# Patient Record
Sex: Male | Born: 1990 | Race: White | Hispanic: No | Marital: Single | Smoking: Never smoker
Health system: Southern US, Community
[De-identification: ages and names within clinical notes are randomized; demographics above are authoritative.]

## PROBLEM LIST (undated history)

## (undated) DIAGNOSIS — I10 Essential (primary) hypertension: Secondary | ICD-10-CM

## (undated) HISTORY — PX: OTHER SURGICAL HISTORY: SHX169

---

## 2015-02-08 ENCOUNTER — Emergency Department (HOSPITAL_COMMUNITY)

## 2015-02-08 ENCOUNTER — Encounter (HOSPITAL_COMMUNITY): Payer: Self-pay | Admitting: Emergency Medicine

## 2015-02-08 ENCOUNTER — Emergency Department (HOSPITAL_COMMUNITY)
Admission: EM | Admit: 2015-02-08 | Discharge: 2015-02-09 | Disposition: A | Discharge status: Home / Self Care | Attending: Emergency Medicine | Admitting: Emergency Medicine

## 2015-02-08 DIAGNOSIS — Y929 Unspecified place or not applicable: Secondary | ICD-10-CM | POA: Diagnosis not present

## 2015-02-08 DIAGNOSIS — S63501A Unspecified sprain of right wrist, initial encounter: Secondary | ICD-10-CM

## 2015-02-08 DIAGNOSIS — Z9889 Other specified postprocedural states: Secondary | ICD-10-CM | POA: Insufficient documentation

## 2015-02-08 DIAGNOSIS — Z79899 Other long term (current) drug therapy: Secondary | ICD-10-CM | POA: Insufficient documentation

## 2015-02-08 DIAGNOSIS — W500XXA Accidental hit or strike by another person, initial encounter: Secondary | ICD-10-CM | POA: Insufficient documentation

## 2015-02-08 DIAGNOSIS — S59911A Unspecified injury of right forearm, initial encounter: Secondary | ICD-10-CM | POA: Diagnosis present

## 2015-02-08 DIAGNOSIS — Y998 Other external cause status: Secondary | ICD-10-CM | POA: Diagnosis not present

## 2015-02-08 DIAGNOSIS — S638X1A Sprain of other part of right wrist and hand, initial encounter: Secondary | ICD-10-CM | POA: Insufficient documentation

## 2015-02-08 DIAGNOSIS — Y9389 Activity, other specified: Secondary | ICD-10-CM | POA: Insufficient documentation
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
## 2015-02-09 MED ORDER — IBUPROFEN 800 MG PO TABS: 800.0000 mg | ORAL_TABLET | Freq: Three times a day (TID) | ORAL | Status: AC

## 2015-02-09 MED ORDER — HYDROCODONE-ACETAMINOPHEN 5-325 MG PO TABS
2.0000 | ORAL_TABLET | Freq: Once | ORAL | Status: AC
  Administered 2015-02-09: 2 via ORAL
  Filled 2015-02-09: qty 2

## 2015-02-09 NOTE — Discharge Instructions (Signed)
Wear the ace wrap for comfort, apply ice, take ibuprofen for pain. Follow up with ortho if symptoms persist. Return here as needed.

## 2015-02-17 ENCOUNTER — Emergency Department (HOSPITAL_COMMUNITY)

## 2015-02-17 ENCOUNTER — Emergency Department (HOSPITAL_COMMUNITY)
Admission: EM | Admit: 2015-02-17 | Discharge: 2015-02-18 | Disposition: A | Discharge status: Home / Self Care | Attending: Emergency Medicine | Admitting: Emergency Medicine

## 2015-02-17 ENCOUNTER — Encounter (HOSPITAL_COMMUNITY): Payer: Self-pay | Admitting: Emergency Medicine

## 2015-02-17 DIAGNOSIS — Z79899 Other long term (current) drug therapy: Secondary | ICD-10-CM | POA: Diagnosis not present

## 2015-02-17 DIAGNOSIS — Z791 Long term (current) use of non-steroidal anti-inflammatories (NSAID): Secondary | ICD-10-CM | POA: Insufficient documentation

## 2015-02-17 DIAGNOSIS — Y998 Other external cause status: Secondary | ICD-10-CM | POA: Diagnosis not present

## 2015-02-17 DIAGNOSIS — S59911A Unspecified injury of right forearm, initial encounter: Secondary | ICD-10-CM | POA: Diagnosis not present

## 2015-02-17 DIAGNOSIS — Y9389 Activity, other specified: Secondary | ICD-10-CM | POA: Diagnosis not present

## 2015-02-17 DIAGNOSIS — Y92143 Cell of prison as the place of occurrence of the external cause: Secondary | ICD-10-CM | POA: Diagnosis not present

## 2015-02-17 DIAGNOSIS — R569 Unspecified convulsions: Secondary | ICD-10-CM | POA: Diagnosis present

## 2015-02-17 DIAGNOSIS — W1839XA Other fall on same level, initial encounter: Secondary | ICD-10-CM | POA: Insufficient documentation

## 2015-02-17 DIAGNOSIS — R251 Tremor, unspecified: Secondary | ICD-10-CM | POA: Insufficient documentation

## 2015-02-17 DIAGNOSIS — I10 Essential (primary) hypertension: Secondary | ICD-10-CM | POA: Insufficient documentation

## 2015-02-17 HISTORY — DX: Essential (primary) hypertension: I10

## 2015-02-17 LAB — BASIC METABOLIC PANEL
Anion gap: 7 (ref 5–15)
BUN: 13 mg/dL (ref 6–20)
CO2: 28 mmol/L (ref 22–32)
CREATININE: 0.77 mg/dL (ref 0.61–1.24)
Calcium: 8.7 mg/dL — ABNORMAL LOW (ref 8.9–10.3)
Chloride: 102 mmol/L (ref 101–111)
GFR calc Af Amer: >60 mL/min (ref >60)
Glucose, Bld: 85 mg/dL (ref 65–99)
Potassium: 4.1 mmol/L (ref 3.5–5.1)
SODIUM: 137 mmol/L (ref 135–145)

## 2015-02-17 LAB — CBC
HCT: 39.3 % (ref 39.0–52.0)
Hemoglobin: 13.5 g/dL (ref 13.0–17.0)
MCH: 29.1 pg (ref 26.0–34.0)
MCHC: 34.4 g/dL (ref 30.0–36.0)
MCV: 84.7 fL (ref 78.0–100.0)
PLATELETS: 170 10*3/uL (ref 150–400)
RBC: 4.64 MIL/uL (ref 4.22–5.81)
RDW: 13.6 % (ref 11.5–15.5)
WBC: 7.2 10*3/uL (ref 4.0–10.5)

## 2015-02-17 LAB — CBG MONITORING, ED: GLUCOSE-CAPILLARY: 96 mg/dL (ref 65–99)

## 2015-02-17 MED ORDER — ACETAMINOPHEN 500 MG PO TABS
1000.0000 mg | ORAL_TABLET | Freq: Once | ORAL | Status: AC
  Administered 2015-02-18: 1000 mg via ORAL
  Filled 2015-02-17: qty 2
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
## 2015-02-17 NOTE — ED Notes (Signed)
cbg of 96 
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
## 2015-02-18 LAB — RAPID URINE DRUG SCREEN, HOSP PERFORMED
Amphetamines: NOT DETECTED
BARBITURATES: NOT DETECTED
Benzodiazepines: NOT DETECTED
Cocaine: NOT DETECTED
OPIATES: NOT DETECTED
TETRAHYDROCANNABINOL: NOT DETECTED

## 2015-02-18 LAB — URINALYSIS, ROUTINE W REFLEX MICROSCOPIC
BILIRUBIN URINE: NEGATIVE
Glucose, UA: NEGATIVE mg/dL
HGB URINE DIPSTICK: NEGATIVE
KETONES UR: NEGATIVE mg/dL
Leukocytes, UA: NEGATIVE
Nitrite: NEGATIVE
PH: 6 (ref 5.0–8.0)
Protein, ur: NEGATIVE mg/dL
SPECIFIC GRAVITY, URINE: 1.01 (ref 1.005–1.030)
UROBILINOGEN UA: 0.2 mg/dL (ref 0.0–1.0)

## 2015-02-18 NOTE — ED Notes (Signed)
Discharge instructions given, pt demonstrated teach back and verbal understanding. No concerns voiced.  

## 2015-02-18 NOTE — Discharge Instructions (Signed)
You were seen in the emergency department for a possible seizure. Your labs, urine, EKG, head CT and on x-ray showed no abnormalities. Given this is your first seizure in many years, we do not start to on seizure medication. I recommend you follow-up with a neurologist who may decide to put you on anti-epileptic medication.  You may take Tylenol 1000 mg every 6 hours as needed for pain.   Seizure, Adult A seizure is abnormal electrical activity in the brain. Seizures usually last from 30 seconds to 2 minutes. There are various types of seizures. Before a seizure, you may have a warning sensation (aura) that a seizure is about to occur. An aura may include the following symptoms:   Fear or anxiety.  Nausea.  Feeling like the room is spinning (vertigo).  Vision changes, such as seeing flashing lights or spots. Common symptoms during a seizure include:  A change in attention or behavior (altered mental status).  Convulsions with rhythmic jerking movements.  Drooling.  Rapid eye movements.  Grunting.  Loss of bladder and bowel control.  Bitter taste in the mouth.  Tongue biting. After a seizure, you may feel confused and sleepy. You may also have an injury resulting from convulsions during the seizure. HOME CARE INSTRUCTIONS   If you are given medicines, take them exactly as prescribed by your health care provider.  Keep all follow-up appointments as directed by your health care provider.  Do not swim or drive or engage in risky activity during which a seizure could cause further injury to you or others until your health care provider says it is OK.  Get adequate rest.  Teach friends and family what to do if you have a seizure. They should:  Lay you on the ground to prevent a fall.  Put a cushion under your head.  Loosen any tight clothing around your neck.  Turn you on your side. If vomiting occurs, this helps keep your airway clear.  Stay with you until you  recover.  Know whether or not you need emergency care. SEEK IMMEDIATE MEDICAL CARE IF:  The seizure lasts longer than 5 minutes.  The seizure is severe or you do not wake up immediately after the seizure.  You have an altered mental status after the seizure.  You are having more frequent or worsening seizures. Someone should drive you to the emergency department or call local emergency services (911 in [place] MAKE SURE YOU:  Understand these instructions.  Will watch your condition.  Will get help right away if you are not doing well or get worse. Document Released: 06/09/2000 Document Revised: 04/02/2013 Document Reviewed: 01/22/2013 [facility] Patient Information 2015 [place], [place]. This information is not intended to replace advice given to you by your health care provider. Make sure you discuss any questions you have with your health care provider.  Driving and Equipment Restrictions Some medical problems make it dangerous to drive, ride a bike, or use machines. Some of these problems are:  A hard blow to the head (concussion).  Passing out (fainting).  Twitching and shaking (seizures).  Low blood sugar.  Taking medicine to help you relax (sedatives).  Taking pain medicines.  Wearing an eye patch.  Wearing splints. This can make it hard to use parts of your body that you need to drive safely. HOME CARE   Do not drive until your doctor says it is okay.  Do not use machines until your doctor says it is okay. You may need a  form signed by your doctor (medical release) before you can drive again. You may also need this form before you do other tasks where you need to be fully alert. MAKE SURE YOU:  Understand these instructions.  Will watch your condition.  Will get help right away if you are not doing well or get worse. Document Released: 07/20/2004 Document Revised: 09/04/2011 Document Reviewed: 10/20/2009 [facility] Patient Information 2015 [place], [place].  This information is not intended to replace advice given to you by your health care provider. Make sure you discuss any questions you have with your health care provider.

## 2015-02-21 ENCOUNTER — Encounter (HOSPITAL_COMMUNITY): Payer: Self-pay | Admitting: Emergency Medicine

## 2015-02-21 ENCOUNTER — Emergency Department (HOSPITAL_COMMUNITY)
Admission: EM | Admit: 2015-02-21 | Discharge: 2015-02-22 | Disposition: A | Discharge status: Home / Self Care | Attending: Emergency Medicine | Admitting: Emergency Medicine

## 2015-02-21 DIAGNOSIS — R569 Unspecified convulsions: Secondary | ICD-10-CM | POA: Insufficient documentation

## 2015-02-21 DIAGNOSIS — K029 Dental caries, unspecified: Secondary | ICD-10-CM | POA: Diagnosis not present

## 2015-02-21 DIAGNOSIS — I10 Essential (primary) hypertension: Secondary | ICD-10-CM | POA: Diagnosis not present

## 2015-02-21 DIAGNOSIS — Z79899 Other long term (current) drug therapy: Secondary | ICD-10-CM | POA: Diagnosis not present

## 2015-02-21 LAB — CBG MONITORING, ED: Glucose-Capillary: 86 mg/dL (ref 65–99)

## 2015-02-21 MED ORDER — LEVETIRACETAM 500 MG PO TABS
500.0000 mg | ORAL_TABLET | Freq: Two times a day (BID) | ORAL | Status: AC
Start: 2015-02-21 — End: ?

## 2015-02-21 MED ORDER — LEVETIRACETAM 500 MG PO TABS
1000.0000 mg | ORAL_TABLET | Freq: Once | ORAL | Status: AC
  Administered 2015-02-22: 1000 mg via ORAL
  Filled 2015-02-21: qty 2
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
## 2015-02-21 NOTE — Discharge Instructions (Signed)
Seizure, Adult °A seizure is abnormal electrical activity in the brain. Seizures usually last from 30 seconds to 2 minutes. There are various types of seizures. °Before a seizure, you may have a warning sensation (aura) that a seizure is about to occur. An aura may include the following symptoms:  °· Fear or anxiety. °· Nausea. °· Feeling like the room is spinning (vertigo). °· Vision changes, such as seeing flashing lights or spots. °Common symptoms during a seizure include: °· A change in attention or behavior (altered mental status). °· Convulsions with rhythmic jerking movements. °· Drooling. °· Rapid eye movements. °· Grunting. °· Loss of bladder and bowel control. °· Bitter taste in the mouth. °· Tongue biting. °After a seizure, you may feel confused and sleepy. You may also have an injury resulting from convulsions during the seizure. °HOME CARE INSTRUCTIONS  °· If you are given medicines, take them exactly as prescribed by your health care provider. °· Keep all follow-up appointments as directed by your health care provider. °· Do not swim or drive or engage in risky activity during which a seizure could cause further injury to you or others until your health care provider says it is OK. °· Get adequate rest. °· Teach friends and family what to do if you have a seizure. They should: °¨ Lay you on the ground to prevent a fall. °¨ Put a cushion under your head. °¨ Loosen any tight clothing around your neck. °¨ Turn you on your side. If vomiting occurs, this helps keep your airway clear. °¨ Stay with you until you recover. °¨ Know whether or not you need emergency care. °SEEK IMMEDIATE MEDICAL CARE IF: °· The seizure lasts longer than 5 minutes. °· The seizure is severe or you do not wake up immediately after the seizure. °· You have an altered mental status after the seizure. °· You are having more frequent or worsening seizures. °Someone should drive you to the emergency department or call local emergency  services (911 in [place] °MAKE SURE YOU: °· Understand these instructions. °· Will watch your condition. °· Will get help right away if you are not doing well or get worse. °Document Released: 06/09/2000 Document Revised: 04/02/2013 Document Reviewed: 01/22/2013 °[employer]® Patient Information ©2015 [employer]. This information is not intended to replace advice given to you by your health care provider. Make sure you discuss any questions you have with your health care provider. °Levetiracetam tablets °What is this medicine? °LEVETIRACETAM (lee ve tye RA se tam) is an antiepileptic drug. It is used with other medicines to treat certain types of seizures. °This medicine may be used for other purposes; ask your health care provider or pharmacist if you have questions. °COMMON BRAND NAME(S): Keppra °What should I tell my health care provider before I take this medicine? °They need to know if you have any of these conditions: °-kidney disease °-suicidal thoughts, plans, or attempt; a previous suicide attempt by you or a family member °-an unusual or allergic reaction to levetiracetam, other medicines, foods, dyes, or preservatives °-pregnant or trying to get pregnant °-breast-feeding °How should I use this medicine? °Take this medicine by mouth with a glass of water. Follow the directions on the prescription label. Swallow the tablets whole. Do not crush or chew this medicine. You may take this medicine with or without food. Take your doses at regular intervals. Do not take your medicine more often than directed. Do not stop taking this medicine or any of your seizure medicines unless   instructed by your doctor or health care professional. Stopping your medicine suddenly can increase your seizures or their severity. °A special MedGuide will be given to you by the pharmacist with each prescription and refill. Be sure to read this information carefully each time. °Contact your pediatrician or health care professional  regarding the use of this medication in children. While this drug may be prescribed for children as young as 4 years of age for selected conditions, precautions do apply. °Overdosage: If you think you have taken too much of this medicine contact a poison control center or emergency room at once. °NOTE: This medicine is only for you. Do not share this medicine with others. °What if I miss a dose? °If you miss a dose, take it as soon as you can. If it is almost time for your next dose, take only that dose. Do not take double or extra doses. °What may interact with this medicine? °This medicine may interact with the following medications: °-carbamazepine °-colesevelam °-probenecid °-sevelamer °This list may not describe all possible interactions. Give your health care provider a list of all the medicines, herbs, non-prescription drugs, or dietary supplements you use. Also tell them if you smoke, drink alcohol, or use illegal drugs. Some items may interact with your medicine. °What should I watch for while using this medicine? °Visit your doctor or health care professional for a regular check on your progress. Wear a medical identification bracelet or chain to say you have epilepsy, and carry a card that lists all your medications. °It is important to take this medicine exactly as instructed by your health care professional. When first starting treatment, your dose may need to be adjusted. It may take weeks or months before your dose is stable. You should contact your doctor or health care professional if your seizures get worse or if you have any new types of seizures. °You may get drowsy or dizzy. Do not drive, use machinery, or do anything that needs mental alertness until you know how this medicine affects you. Do not stand or sit up quickly, especially if you are an older patient. This reduces the risk of dizzy or fainting spells. Alcohol may interfere with the effect of this medicine. Avoid alcoholic drinks. °The  use of this medicine may increase the chance of suicidal thoughts or actions. Pay special attention to how you are responding while on this medicine. Any worsening of mood, or thoughts of suicide or dying should be reported to your health care professional right away. °Women who become pregnant while using this medicine may enroll in the [place] Antiepileptic Drug Pregnancy Registry by calling 1-888-233-2334. This registry collects information about the safety of antiepileptic drug use during pregnancy. °What side effects may I notice from receiving this medicine? °Side effects you should report to your doctor or health care professional as soon as possible: °-allergic reactions like skin rash, itching or hives, swelling of the face, lips, or tongue °-breathing problems °-dark urine °-general ill feeling or flu-like symptoms °-problems with balance, talking, walking °-unusually weak or tired °-worsening of mood, thoughts or actions of suicide or dying °-yellowing of the eyes or skin °Side effects that usually do not require medical attention (report to your doctor or health care professional if they continue or are bothersome): °-diarrhea °-dizzy, drowsy °-headache °-loss of appetite °This list may not describe all possible side effects. Call your doctor for medical advice about side effects. You may report side effects to FDA at 1-800-FDA-1088. °Where should   I keep my medicine? °Keep out of reach of children. °Store at room temperature between 15 and 30 degrees C (59 and 86 degrees F). Throw away any unused medicine after the expiration date. °NOTE: This sheet is a summary. It may not cover all possible information. If you have questions about this medicine, talk to your doctor, pharmacist, or health care provider. °© 2015, [employer]. (2013-05-06 08:42:48) ° °

## 2015-02-21 NOTE — ED Provider Notes (Signed)
CSN: [id number]     Arrival date & time 02/21/15  2314 History  This chart was scribed for Dione Booze, MD by Budd Palmer, ED Scribe. This patient was seen in room [facility]18[facility]18 and the patient's care was started at 11:45 PM.    Chief Complaint  Patient presents with  . Seizures   The history is provided by the patient and a caregiver. No language interpreter was used.   HPI Comments: [place] is a 24 y.o. male who presents to the Emergency Department complaining of seizures that occurred at about 10 PM tonight, lasting for about 2-3 minutes. Per his caregiver, he seized twice more during the 20 minute ride to the hospital and was unresponsive for a time after each episode. Pt notes associated breaking of a previously damaged upper left tooth. He has been seen for the same in the ED 3 days ago. Pt denies bladder or bowel incontinence.  Past Medical History  Diagnosis Date  . Hypertension    Past Surgical History  Procedure Laterality Date  . Arm surgery     No family history on file. Social History  Substance Use Topics  . Smoking status: Never Smoker   . Smokeless tobacco: None  . Alcohol Use: No    Review of Systems  HENT: Positive for dental problem.   Neurological: Positive for seizures.  All other systems reviewed and are negative.   Allergies  Review of patient's allergies indicates not on file.  Home Medications   Prior to Admission medications   Medication Sig Start Date End Date Taking? Authorizing Provider  ibuprofen (ADVIL,MOTRIN) 800 MG tablet Take 1 tablet (800 mg total) by mouth 3 (three) times daily. 02/09/15   Hope Orlene Och, NP  lisinopril (PRINIVIL,ZESTRIL) 10 MG tablet Take 10 mg by mouth daily.    Historical Provider, MD   BP 134/83 mmHg  Pulse 77  Temp(Src) 98.5 F (36.9 C) (Oral)  Ht 5\' 10"  (1.778 m)  Wt 185 lb (83.915 kg)  BMI 26.54 kg/m2  SpO2 100% Physical Exam  Constitutional: He is oriented to person, place, and time. He appears  well-developed and well-nourished.  HENT:  Head: Normocephalic and atraumatic.  Tooth # 15 has a large caries on the mucosal surface. No sharp edges.  Eyes: EOM are normal. Pupils are equal, round, and reactive to light.  Neck: Normal range of motion. Neck supple. No JVD present.  Cardiovascular: Normal rate, regular rhythm and normal heart sounds.   No murmur heard. Pulmonary/Chest: Effort normal and breath sounds normal. He has no wheezes. He has no rales. He exhibits no tenderness.  Abdominal: Soft. Bowel sounds are normal. He exhibits no distension and no mass. There is no tenderness.  Musculoskeletal: Normal range of motion. He exhibits no edema.  Lymphadenopathy:    He has no cervical adenopathy.  Neurological: He is alert and oriented to person, place, and time. No cranial nerve deficit. He exhibits normal muscle tone. Coordination normal.  Skin: Skin is warm and dry. No rash noted. He is not diaphoretic. No erythema.  Psychiatric: He has a normal mood and affect. His behavior is normal.  Nursing note and vitals reviewed.   ED Course  Procedures  DIAGNOSTIC STUDIES: Oxygen Saturation is 100% on RA, normal by my interpretation.    COORDINATION OF CARE: 11:51 PM - Discussed plans to order anti-convulsive medication. Advised to take medication 2x per day and f/u with his PCP. Pt advised of plan for treatment and pt agrees.  EKG Interpretation   Date/Time:  Sunday February 21 2015 23:32:04 EDT Ventricular Rate:  84 PR Interval:  146 QRS Duration: 97 QT Interval:  363 QTC Calculation: 429 R Axis:   95 Text Interpretation:  Sinus rhythm Borderline right axis deviation When  compared with ECG of 02/17/2015, No significant change was found Confirmed  by [facility]  MD, Shomari Scicchitano ([id number]) on 02/22/2015 12:04:14 AM      MDM   Final diagnoses:  Seizure    Seizure. Old records are reviewed and he was seen in the ED 3 days ago with seizures. He had complete seizure workup done at that  time, so there is no need to repeat them. He apparently had seizures as a child. I do not feel he will need EEG. However, with ongoing seizures, he will need to be placed on anticonvulsives. He is given a dose of levetiracetam here and discharged with prescription for levetiracetam.  I personally performed the services described in this documentation, which was scribed in my presence. The recorded information has been reviewed and is accurate.      Dione Booze, MD 02/22/15 [id number]

## 2015-02-21 NOTE — ED Notes (Signed)
EKG done and given to Dr Preston Fleeting

## 2015-02-22 NOTE — ED Notes (Signed)
Report given to triage nurse at [facility]

## 2016-02-18 IMAGING — CT CT HEAD W/O CM
1 series · 16 of 30 positions shown, 20 images · non-contrast
Comparison: None.

CLINICAL DATA: Seizure today.

EXAM:
CT HEAD WITHOUT CONTRAST
TECHNIQUE: Contiguous axial images were obtained from the base of the skull
through the vertex without intravenous contrast.

[Series 2: headtrauma 4.8 h37s · axial · 0.48mm/px · z∈[+96,+227]mm · 16 of 30 slices shown, 20 images]
[im 2/30  brain]
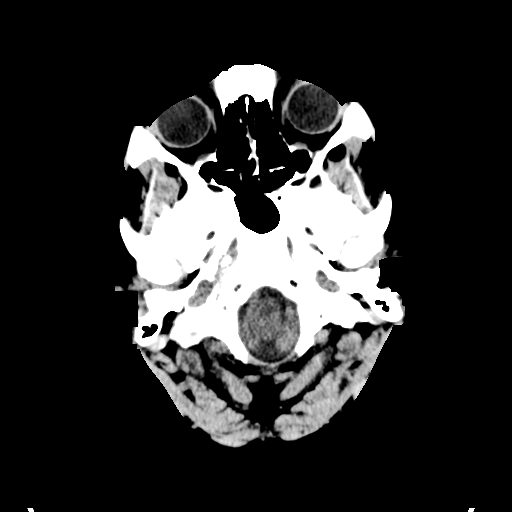
[im 2/30  bone]
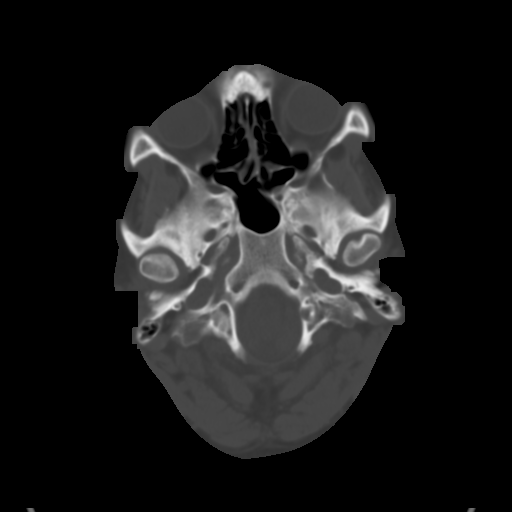
[im 4/30  brain]
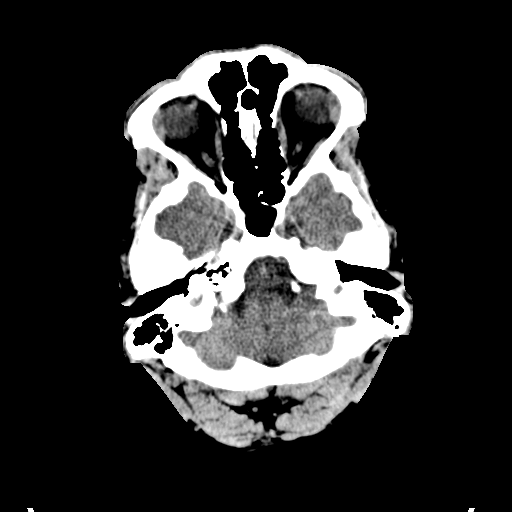
[im 6/30  brain]
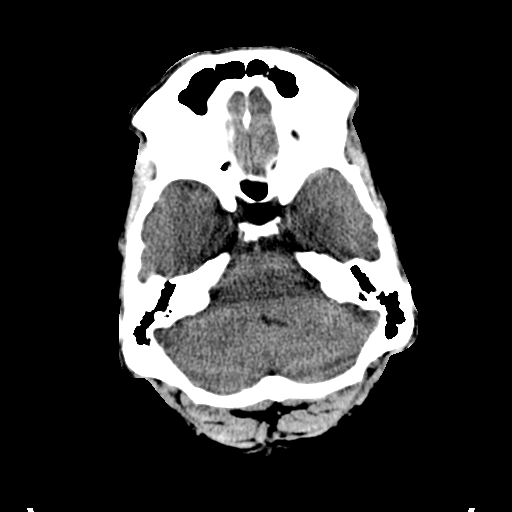
[im 8/30  brain]
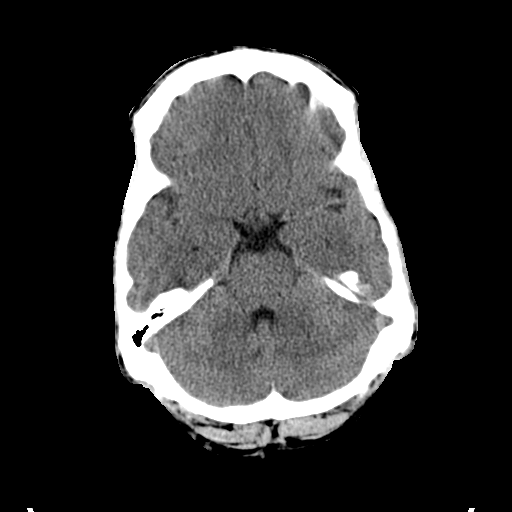
[im 9/30  brain]
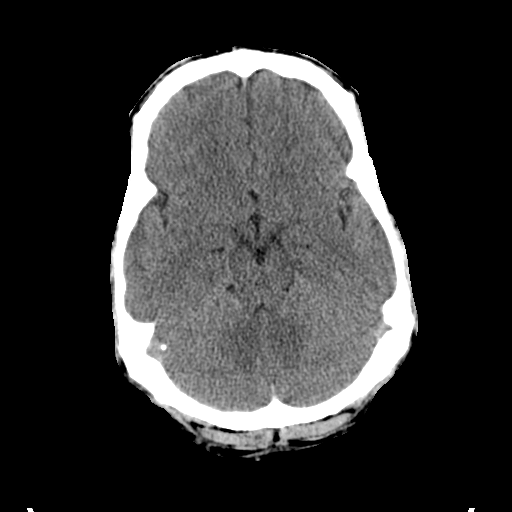
[im 9/30  bone]
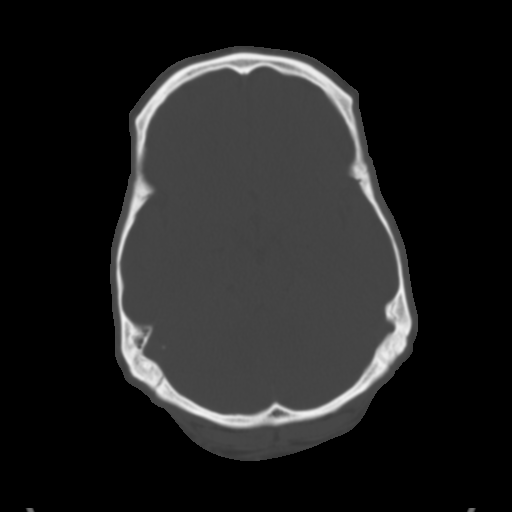
[im 11/30  brain]
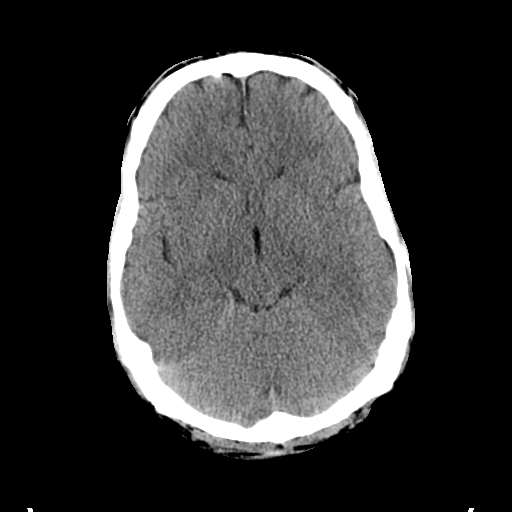
[im 13/30  brain]
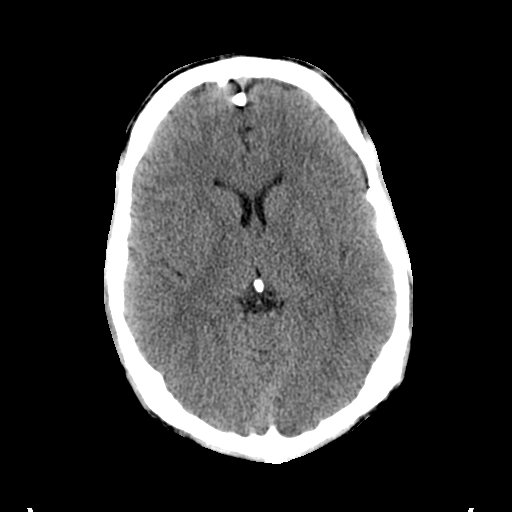
[im 15/30  brain]
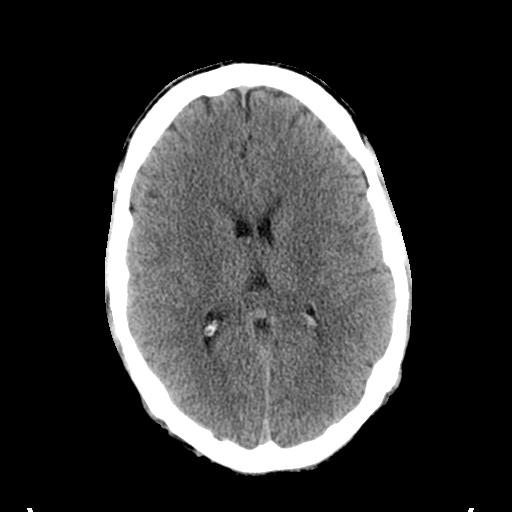
[im 16/30  brain]
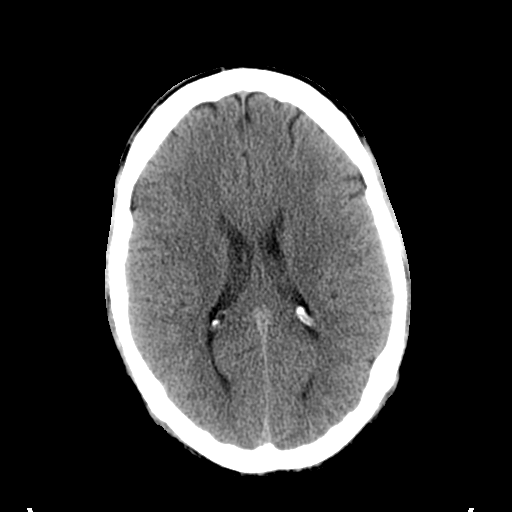
[im 16/30  bone]
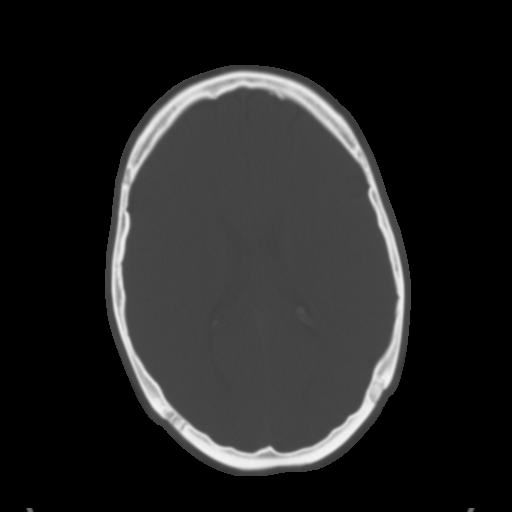
[im 18/30  brain]
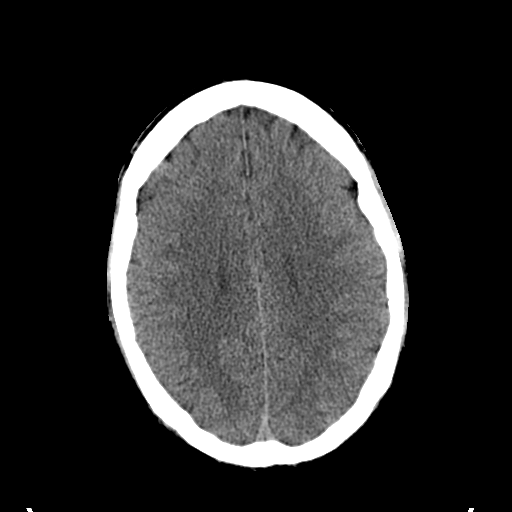
[im 20/30  brain]
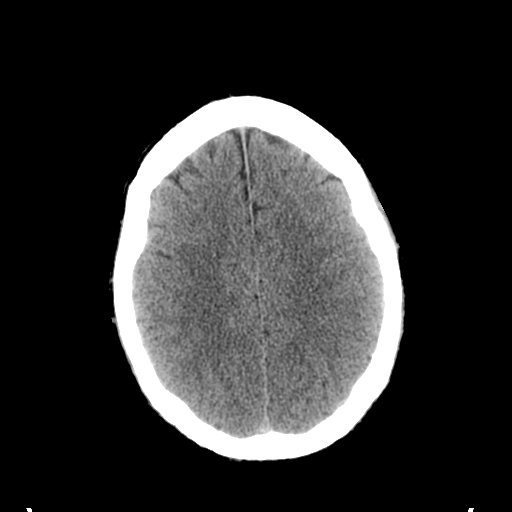
[im 22/30  brain]
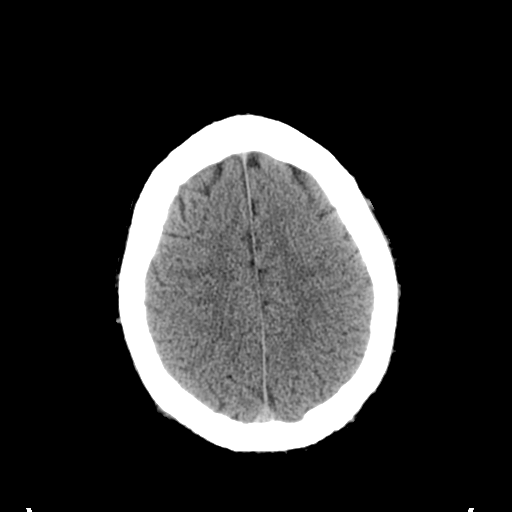
[im 23/30  brain]
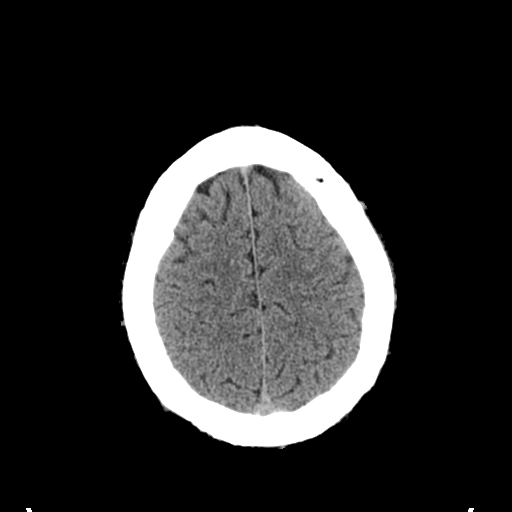
[im 23/30  bone]
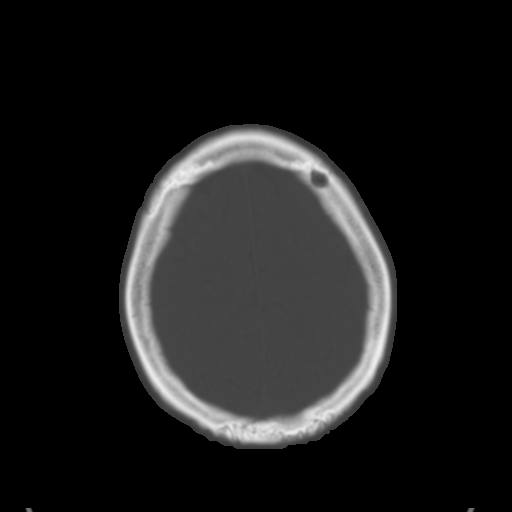
[im 25/30  brain]
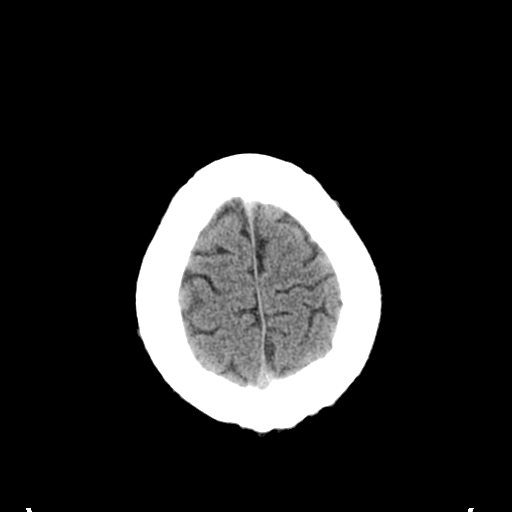
[im 27/30  brain]
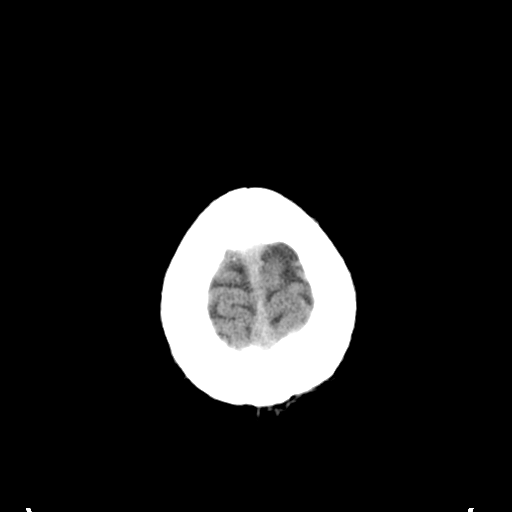
[im 29/30  brain]
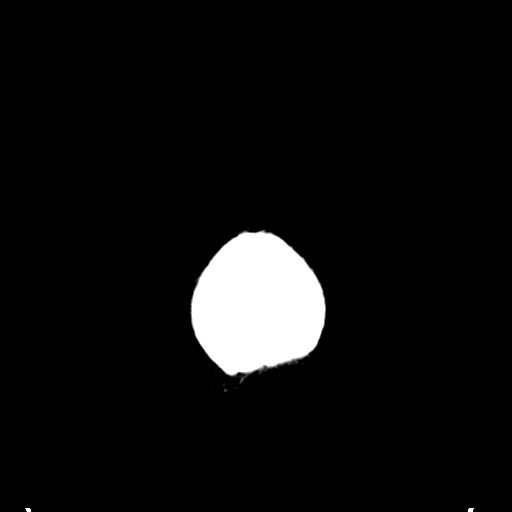

[16 of 30 positions shown; findings below may reference images not displayed]

FINDINGS: No intracranial hemorrhage, mass effect, or midline shift. No
hydrocephalus. The basilar cisterns are patent. No evidence of
territorial infarct. No intracranial fluid collection. Calvarium is
intact. Included paranasal sinuses and mastoid air cells are well
aerated.
IMPRESSION: No acute intracranial abnormality.

## 2021-12-24 DEATH — deceased
